# Patient Record
Sex: Female | Born: 1995 | Race: White | Hispanic: No | Marital: Single | Smoking: Never smoker
Health system: Southern US, Community
[De-identification: ages and names within clinical notes are randomized; demographics above are authoritative.]

---
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
## 2017-06-16 ENCOUNTER — Encounter: Payer: PRIVATE HEALTH INSURANCE | Admitting: Internal Medicine

## 2020-04-14 ENCOUNTER — Other Ambulatory Visit: Payer: Self-pay

## 2020-04-17 ENCOUNTER — Ambulatory Visit (INDEPENDENT_AMBULATORY_CARE_PROVIDER_SITE_OTHER): Payer: Self-pay | Admitting: Nurse Practitioner

## 2020-04-17 ENCOUNTER — Other Ambulatory Visit: Payer: Self-pay

## 2020-04-17 ENCOUNTER — Encounter: Payer: Self-pay | Admitting: Nurse Practitioner

## 2020-04-17 VITALS — BP 122/82 | HR 88 | Temp 97.7°F | Ht 61.75 in | Wt 197.0 lb

## 2020-04-17 DIAGNOSIS — Z789 Other specified health status: Secondary | ICD-10-CM | POA: Insufficient documentation

## 2020-04-17 DIAGNOSIS — Z298 Encounter for other specified prophylactic measures: Secondary | ICD-10-CM

## 2020-04-17 MED ORDER — FLUCONAZOLE 150 MG PO TABS: 150.0000 mg | ORAL_TABLET | Freq: Once | ORAL | 0 refills | Status: AC

## 2020-04-17 MED ORDER — DOXYCYCLINE HYCLATE 100 MG PO TABS: 100.0000 mg | ORAL_TABLET | Freq: Two times a day (BID) | ORAL | 0 refills | Status: AC

## 2020-04-17 NOTE — Progress Notes (Addendum)
Established Patient Office Visit  Subjective:  Patient ID: Veronica Webster, female    DOB: 1995-08-22  Age: 24 y.o. MRN: [id number]  CC:  Chief Complaint  Patient presents with  . Going to [place] on a mission trip and would like to get some Malaria medication in case she needs it.    HPI Veronica Webster presents for malaria prophylaxis while she travels abroad to [place] for a mission trip. She states she has previously traveled abroad for a mission trip and has received all required vaccinations. She denies any recent illnesses or sick contacts. She denies being currently sexually active or possible pregnancy.  History reviewed. No pertinent past medical history.  History reviewed. No pertinent surgical history.  Family History  Problem Relation Age of Onset  . Hypertension Other   . Cancer Other   . Diabetes Mellitus II Other     Social History   Socioeconomic History  . Marital status: Single    Spouse name: Not on file  . Number of children: Not on file  . Years of education: Not on file  . Highest education level: Not on file  Occupational History  . Not on file  Tobacco Use  . Smoking status: Never Smoker  . Smokeless tobacco: Never Used  Vaping Use  . Vaping Use: Never used  Substance and Sexual Activity  . Alcohol use: Never  . Drug use: Never  . Sexual activity: Not on file  Other Topics Concern  . Not on file  Social History Narrative  . Not on file   Social Determinants of Health   Financial Resource Strain:   . Difficulty of Paying Living Expenses: Not on file  Food Insecurity:   . Worried About Charity fundraiser in the Last Year: Not on file  . Ran Out of Food in the Last Year: Not on file  Transportation Needs:   . Lack of Transportation (Medical): Not on file  . Lack of Transportation (Non-Medical): Not on file  Physical Activity:   . Days of Exercise per Week: Not on file  . Minutes of Exercise per Session: Not on file    Stress:   . Feeling of Stress : Not on file  Social Connections:   . Frequency of Communication with Friends and Family: Not on file  . Frequency of Social Gatherings with Friends and Family: Not on file  . Attends Religious Services: Not on file  . Active Member of Clubs or Organizations: Not on file  . Attends Archivist Meetings: Not on file  . Marital Status: Not on file  Intimate Partner Violence:   . Fear of Current or Ex-Partner: Not on file  . Emotionally Abused: Not on file  . Physically Abused: Not on file  . Sexually Abused: Not on file    No outpatient medications prior to visit.   No facility-administered medications prior to visit.    Allergies  Allergen Reactions  . Sulfa Antibiotics Hives    ROS Review of Systems  Constitutional: Negative for activity change, appetite change, chills, fatigue, fever and unexpected weight change.  HENT: Negative for congestion.   Respiratory: Negative for cough.   Cardiovascular: Negative for chest pain and leg swelling.  Endocrine: Negative for cold intolerance and heat intolerance.  Genitourinary: Negative for difficulty urinating and dysuria.  Skin: Negative.   Allergic/Immunologic: Negative for environmental allergies and food allergies.  Neurological: Negative for weakness and headaches.  Hematological:  Negative for adenopathy.  Psychiatric/Behavioral: Negative.       Objective:    Physical Exam HENT:     Head: Normocephalic.     Right Ear: Tympanic membrane normal.     Mouth/Throat:     Mouth: Mucous membranes are moist.  Cardiovascular:     Rate and Rhythm: Normal rate and regular rhythm.     Pulses: Normal pulses.     Heart sounds: Normal heart sounds.  Pulmonary:     Effort: Pulmonary effort is normal.     Breath sounds: Normal breath sounds.  Abdominal:     Palpations: Abdomen is soft.  Skin:    General: Skin is warm and dry.  Neurological:     Mental Status: She is alert and oriented  to person, place, and time.  Psychiatric:        Mood and Affect: Mood normal.        Behavior: Behavior normal.     BP 122/82 (BP Location: Left Arm, Patient Position: Sitting)   Pulse 88   Temp 97.7 F (36.5 C) (Temporal)   Ht 5' 1.75" (1.568 m)   Wt 197 lb (89.4 kg)   SpO2 100%   BMI 36.32 kg/m  Wt Readings from Last 3 Encounters:  04/17/20 197 lb (89.4 kg)     Health Maintenance Due  Topic Date Due  . Hepatitis C Screening  Never done  . COVID-19 Vaccine (1) Never done  . HIV Screening  Never done  . PAP-Cervical Cytology Screening  Never done  . PAP SMEAR-Modifier  Never done  . INFLUENZA VACCINE  02/16/2020  . TETANUS/TDAP  03/29/2020    There are no preventive care reminders to display for this patient.  No results found for: TSH No results found for: WBC, HGB, HCT, MCV, PLT No results found for: NA, K, CHLORIDE, CO2, GLUCOSE, BUN, CREATININE, BILITOT, ALKPHOS, AST, ALT, PROT, ALBUMIN, CALCIUM, ANIONGAP, EGFR, GFR No results found for: CHOL No results found for: HDL No results found for: LDLCALC No results found for: TRIG No results found for: CHOLHDL No results found for: HGBA1C    Assessment & Plan:  1. History of international travel - doxycycline (VIBRA-TABS) 100 MG tablet; Take 1 tablet (100 mg total) by mouth 2 (two) times daily for 10 days.  Dispense: 20 tablet; Refill: 0 - fluconazole (DIFLUCAN) 150 MG tablet; Take 1 tablet (150 mg total) by mouth once for 1 dose.  Dispense: 1 tablet; Refill: 0  2. Need for malaria prophylaxis Doxycycline 185m daily during mission trip to [place] for 28 days daily upon return to the [place] Patient's family notified of medication dosage change, verbalized understanding.    Meds ordered this encounter  Medications  . doxycycline (VIBRA-TABS) 100 MG tablet    Sig: Take 1 tablet (100 mg total) by mouth 2 (two) times daily for 10 days.    Dispense:  20 tablet    Refill:  0    Order Specific Question:    Supervising Provider    Answer:Rochel Brome[[id number] . fluconazole (DIFLUCAN) 150 MG tablet    Sig: Take 1 tablet (150 mg total) by mouth once for 1 dose.    Dispense:  1 tablet    Refill:  0    Order Specific Question:   Supervising Provider    Answer:Shelton Silvas   Follow-up: Return for any symptoms or complications.   Upon review of standard of care following  pt departure from office, I changed her doxycycline to 100 mg once daily prior to and during trip and for 28 days after return. Notified patient's mother by phone, Veronica Webster, who will notify patient of change.  Rip Harbour, NP

## 2020-04-17 NOTE — Patient Instructions (Addendum)
Follow-up as needed  Malaria  Malaria is a disease that is caused by a type of germ that can live inside a person's body (parasite). The malaria parasite can get into a person's blood when he or she is bitten by a certain type of mosquito (Anopheles mosquito). These mosquitoes are most common in tropical areas of the world. Usually, they are not found in the [place]. If an infected mosquito bites you, the parasites can travel through your blood to your liver. The parasites mature in your liver, then they are released into your blood. They can then invade red blood cells. The parasites multiply inside red blood cells and cause the cells to break open (rupture). This infects more red blood cells. Losing red blood cells may cause you to have a low red blood cell count (anemia). What are the causes? This condition is caused by a Plasmodium parasite. Five different types of the parasite can cause disease in humans. Most cases of malaria come from a mosquito bite, but the disease can also be passed:  Through a blood transfusion.  Through an organ transplant.  By sharing used needles or syringes.  From an infected pregnant woman to her unborn baby before or during delivery. What increases the risk? This condition is more likely to develop in:  People who live or travel in an area of the world where malaria is common.  People who receive donated blood (transfusion) or an organ transplant that is contaminated with infected blood cells.  People who share needles with a person who is infected with malaria.  Children.  Pregnant women.  People who have never been exposed to malaria parasites before. These people have not built up protection (immunity) against the parasites. What are the signs or symptoms? Symptoms can vary depending on which parasite caused the infection. Symptoms usually come and go or occur in cycles. The first symptoms of this condition usually start 10 days to 4 weeks  after the mosquito bite. Symptoms for all types of malaria usually happen in this order:  Chills, along with headache, muscle aches, fatigue, nausea, and vomiting.  Fever, along with hot and dry skin.  Headache.  Low blood pressure (hypotension).  Drenching sweats, along with weakness and exhaustion. Other symptoms include:  Diarrhea or bloody stools (feces).  Blood in the urine.  Low blood sugar (hypoglycemia).  Yellowing of the skin and the whites of the eyes (jaundice).  Enlarged spleen or liver.  Kidney function problems. Severe symptoms include:  Trouble breathing.  Seizures.  Confusion or loss of consciousness (coma).  Problems with blood clotting. How is this diagnosed? This condition may be diagnosed based on:  Your symptoms and medical history. Your health care provider may suspect malaria if you have been living or traveling in an area where the disease is common.  A physical exam.  Blood tests to confirm the diagnosis. These may include: ? Examining a blood sample under a microscope. This is the most common way to diagnose malaria. Each type of parasite looks different under the microscope. Identifying which parasite is causing your infection will help your health care provider decide which medicines will work best for treatment. ? Complete blood count (CBC) to check for anemia. How is this treated? This condition may be treated with many different medicines or combinations of medicines, often requiring treatment in the hospital. The best treatment for you will depend on:  Which type of parasite is causing your infection.  Whether various medicines  are effective against it.  How you got the infection.  How severe your infection is.  Your age and your general health.  If you are pregnant. Follow these instructions at home:   Take over-the-counter and prescription medicines only as told by your health care provider.  Rest at home until your  symptoms improve.  Drink enough fluid to keep your urine pale yellow.  Keep all follow-up visits as told by your health care provider. This is important. How is this prevented?  If you will be traveling to an area where malaria is common: ? Visit the website of the Centers for Disease Control and Prevention (CDC) to check your risk: KeyPreview.sewww.cdc.gov/malaria/travelers/index.html ? Visit your health care provider at least 2 weeks before you leave. You may be given a medicine to help prevent malaria.  You can also prevent malaria by: ? Using insect repellent that has diethyltoluamide (DEET) in it. ? Staying indoors after daytime starts to turn to night or dusk. ? Wearing protective clothing that covers your arms and legs. ? Hanging a mosquito net over your bed. Contact a health care provider if:  You have any malaria symptoms.  You develop jaundice. Get help right away if:  You have a seizure.  You have bleeding.  You have trouble breathing. Summary  Malaria is a disease that is caused by a type of germ that can live inside a person's body (parasite). The malaria parasite can get into a person's blood when he or she is bitten by a certain type of mosquito.  Malaria can be prevented by taking measures to prevent mosquito bites.  Treatment for malaria depends on many factors, including how severe the infection is and which type of parasite is causing it.  Follow instructions from your health care provider about medicines, rest, getting enough fluids, and keeping all follow-up visits. This information is not intended to replace advice given to you by your health care provider. Make sure you discuss any questions you have with your health care provider. Document Revised: 04/20/2018 Document Reviewed: 04/20/2018 Elsevier Patient Education  2020 [employer] Place travel patient instructions here.  Doxycycline delayed-release capsules What is this medicine? DOXYCYCLINE (dox i SYE  kleen) is a tetracycline antibiotic. It is used to treat certain kinds of bacterial infections, Lyme disease, and malaria. It will not work for colds, flu, or other viral infections. This medicine may be used for other purposes; ask your health care provider or pharmacist if you have questions. COMMON BRAND NAME(S): Oracea What should I tell my health care provider before I take this medicine? They need to know if you have any of these conditions:  bowel disease like colitis  liver disease  long exposure to sunlight like working outdoors  an unusual or allergic reaction to doxycycline, tetracycline antibiotics, other medicines, foods, dyes, or preservatives  pregnant or trying to get pregnant  breast-feeding How should I use this medicine? Take this medicine by mouth with a full glass of water. Follow the directions on the prescription label. Do not crush or chew. The capsules may be opened and the pellets sprinkled on applesauce. Swallow the pellets whole without chewing. Follow with an 8 ounce glass of water to help you swallow all the pellets. Do not prepare a dose and store for later use. The applesauce mixture should be taken immediately after you prepare it. It is best to take this medicine without other food, but if it upsets your stomach take it with food. Take your  medicine at regular intervals. Do not take your medicine more often than directed. Take all of your medicine as directed even if you think your are better. Do not skip doses or stop your medicine early. Talk to your pediatrician regarding the use of this medicine in children. While this drug may be prescribed for selected conditions, precautions do apply. Overdosage: If you think you have taken too much of this medicine contact a poison control center or emergency room at once. NOTE: This medicine is only for you. Do not share this medicine with others. What if I miss a dose? If you miss a dose, take it as soon as you can.  If it is almost time for your next dose, take only that dose. Do not take double or extra doses. What may interact with this medicine?  antacids  barbiturates  birth control pills  bismuth subsalicylate  carbamazepine  methoxyflurane  other antibiotics  phenytoin  vitamins that contain iron  warfarin This list may not describe all possible interactions. Give your health care provider a list of all the medicines, herbs, non-prescription drugs, or dietary supplements you use. Also tell them if you smoke, drink alcohol, or use illegal drugs. Some items may interact with your medicine. What should I watch for while using this medicine? Tell your doctor or health care professional if your symptoms do not improve. Do not treat diarrhea with over the counter products. Contact your doctor if you have diarrhea that lasts more than 2 days or if it is severe and watery. Do not take this medicine just before going to bed. It may not dissolve properly when you lay down and can cause pain in your throat. Drink plenty of fluids while taking this medicine to also help reduce irritation in your throat. This medicine can make you more sensitive to the sun. Keep out of the sun. If you cannot avoid being in the sun, wear protective clothing and use sunscreen. Do not use sun lamps or tanning beds/booths. If you are being treated for a sexually transmitted infection, avoid sexual contact until you have finished your treatment. Your sexual partner may also need treatment. Avoid antacids, aluminum, calcium, magnesium, and iron products for 4 hours before and 2 hours after taking a dose of this medicine. Birth control pills may not work properly while you are taking this medicine. Talk to your doctor about using an extra method of birth control. If you are using this medicine to prevent malaria, you should still protect yourself from contact with mosquitos. Stay in screened-in areas, use mosquito nets, keep  your body covered, and use an insect repellent. What side effects may I notice from receiving this medicine? Side effects that you should report to your doctor or health care professional as soon as possible:  allergic reactions like skin rash, itching or hives, swelling of the face, lips, or tongue  difficulty breathing  fever  itching in the rectal or genital area  pain on swallowing  rash, fever, and swollen lymph nodes  redness, blistering, peeling or loosening of the skin, including inside the mouth  severe stomach pain or cramps  unusual bleeding or bruising  unusually weak or tired  yellowing of the eyes or skin Side effects that usually do not require medical attention (report to your doctor or health care professional if they continue or are bothersome):  diarrhea  loss of appetite  nausea, vomiting This list may not describe all possible side effects. Call your doctor for  medical advice about side effects. You may report side effects to FDA at 1-800-FDA-1088. Where should I keep my medicine? Keep out of the reach of children. Store at room temperature, below 25 degrees C (77 degrees F). Protect from light. Keep container tightly closed. Throw away any unused medicine after the expiration date. Taking this medicine after the expiration date can make you seriously ill. NOTE: This sheet is a summary. It may not cover all possible information. If you have questions about this medicine, talk to your doctor, pharmacist, or health care provider.  2020 Elsevier/Gold Standard (2018-10-04 13:23:57)

## 2020-04-20 DIAGNOSIS — Z298 Encounter for other specified prophylactic measures: Secondary | ICD-10-CM | POA: Insufficient documentation

## 2020-04-20 MED ORDER — DOXYCYCLINE HYCLATE 100 MG PO TABS: ORAL_TABLET | ORAL | 0 refills | Status: DC

## 2020-04-27 ENCOUNTER — Ambulatory Visit: Payer: PRIVATE HEALTH INSURANCE | Admitting: Nurse Practitioner

## 2020-07-21 ENCOUNTER — Telehealth (INDEPENDENT_AMBULATORY_CARE_PROVIDER_SITE_OTHER): Payer: Self-pay | Admitting: Nurse Practitioner

## 2020-07-21 ENCOUNTER — Encounter: Payer: Self-pay | Admitting: Nurse Practitioner

## 2020-07-21 VITALS — Ht 62.0 in | Wt 197.0 lb

## 2020-07-21 DIAGNOSIS — U071 COVID-19: Secondary | ICD-10-CM

## 2020-07-21 MED ORDER — LORATADINE 10 MG PO TABS: 10.0000 mg | ORAL_TABLET | Freq: Every day | ORAL | 0 refills | Status: DC

## 2020-07-21 MED ORDER — FLUTICASONE PROPIONATE 50 MCG/ACT NA SUSP: 2.0000 | Freq: Every day | NASAL | 6 refills | Status: DC

## 2020-07-21 NOTE — Progress Notes (Signed)
Virtual Visit via Telephone Note   This visit type was conducted due to national recommendations for restrictions regarding the COVID-19 Pandemic (e.g. social distancing) in an effort to limit this patient's exposure and mitigate transmission in our community.  Due to her co-morbid illnesses, this patient is at least at moderate risk for complications without adequate follow up.  This format is felt to be most appropriate for this patient at this time.  The patient did not have access to video technology/had technical difficulties with video requiring transitioning to audio format only (telephone).  All issues noted in this document were discussed and addressed.  No physical exam could be performed with this format.  Patient verbally consented to a telehealth visit.   Date:  07/21/2020   ID:  Veronica Webster, DOB 1995-10-31, MRN [id number]  Patient Location: Home Provider Location: Office/Clinic  PCP:  Janie Morning, NP   Evaluation Performed:  Follow-Up Visit  Chief Complaint:  COVID-19 positive  History of Present Illness:    Veronica Webster is a 25 y.o. female with sinus congestion, sore throat, and bilateral ear pain. Onset of symptoms was 3-days ago. Treatment has included Ibuprofen, Mucinex, and Vitamins. She performed a home COVID-19 test 2-days ago that was positive.   The patient does have symptoms concerning for COVID-19 infection (fever, chills, cough, or new shortness of breath).    History reviewed. No pertinent past medical history.  History reviewed. No pertinent surgical history.  Family History  Problem Relation Age of Onset  . Hypertension Other   . Cancer Other   . Diabetes Mellitus II Other     Social History   Socioeconomic History  . Marital status: Single    Spouse name: Not on file  . Number of children: Not on file  . Years of education: Not on file  . Highest education level: Not on file  Occupational History  . Not on file  Tobacco Use  . Smoking  status: Never Smoker  . Smokeless tobacco: Never Used  Vaping Use  . Vaping Use: Never used  Substance and Sexual Activity  . Alcohol use: Never  . Drug use: Never  . Sexual activity: Not on file  Other Topics Concern  . Not on file  Social History Narrative  . Not on file   Social Determinants of Health   Financial Resource Strain: Not on file  Food Insecurity: Not on file  Transportation Needs: Not on file  Physical Activity: Not on file  Stress: Not on file  Social Connections: Not on file  Intimate Partner Violence: Not on file    Outpatient Medications Prior to Visit  Medication Sig Dispense Refill  . doxycycline (VIBRA-TABS) 100 MG tablet Take one tablet daily by mouth for 28 days after returning to [place] for malaria prophylaxis 24 tablet 0   No facility-administered medications prior to visit.    Allergies:   Sulfa antibiotics   Social History   Tobacco Use  . Smoking status: Never Smoker  . Smokeless tobacco: Never Used  Vaping Use  . Vaping Use: Never used  Substance Use Topics  . Alcohol use: Never  . Drug use: Never     Review of Systems  Constitutional: Positive for malaise/fatigue. Negative for fever.  HENT: Positive for congestion, ear pain (Bilateral), sinus pain and sore throat.   Respiratory: Positive for cough and sputum production. Negative for shortness of breath and wheezing.   Cardiovascular: Negative for chest pain.  Gastrointestinal: Negative for  abdominal pain, constipation, diarrhea, heartburn, nausea and vomiting.  Genitourinary: Negative for frequency and urgency.  Musculoskeletal: Negative for back pain, joint pain and myalgias.  Neurological: Negative for dizziness and headaches.     Labs/Other Tests and Data Reviewed:    Recent Labs: No results found for requested labs within last 8760 hours.   Recent Lipid Panel No results found for: CHOL, TRIG, HDL, CHOLHDL, LDLCALC, LDLDIRECT  Wt Readings from Last 3 Encounters:   07/21/20 197 lb (89.4 kg)  04/17/20 197 lb (89.4 kg)     Objective:    Vital Signs:  Ht 5\' 2"  (1.575 m)   Wt 197 lb (89.4 kg)   BMI 36.03 kg/m    Physical Exam Vitals reviewed.    No physical exam due to telemedicine visit  ASSESSMENT & PLAN:     1. COVID-19 - fluticasone (FLONASE) 50 MCG/ACT nasal spray; Place 2 sprays into both nostrils daily.  Dispense: 16 g; Refill: 6 - loratadine (CLARITIN) 10 MG tablet; Take 1 tablet (10 mg total) by mouth daily.  Dispense: 90 tablet; Refill: 0    COVID-19 Education: The signs and symptoms of COVID-19 were discussed with the patient and how to seek care for testing (follow up with PCP or arrange E-visit). The importance of social distancing was discussed today.   I spent 10 minutes dedicated to the care of this patient on the date of this encounter to include telephone time with the patient, as well as:EMR and prescription managemnt  Follow Up:  Virtual Visit  prn  Signed,  , DNP 07/21/2020 21:52 [facility] [place]

## 2021-08-16 ENCOUNTER — Other Ambulatory Visit: Payer: Self-pay | Admitting: Family Medicine

## 2021-09-23 NOTE — Progress Notes (Signed)
? ?Acute Office Visit ? ?Subjective:  ? ? Patient ID: Veronica Webster, female    DOB: 08/18/1995, 26 y.o.   MRN: [id number] ? ?Chief Complaint  ?Patient presents with  ? Diarrhea  ? ? ?HPI: ?Veronica Webster is a 26 year old Caucasian female that presents for evaluation of diarrhea and vomiting. Onset of symptoms was 6-days ago. Treatment has included pushing fluid, Pepto Bismol and Imodium. 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 Denies fever, chills, rectal bleeding, or intractable vomiting.She has lost 9-pounds since last in-office visit on 04/17/20, pt states she weight loss was intentional and she is pleased.  ? ?History reviewed. No pertinent past medical history. ? ?History reviewed. No pertinent surgical history. ? ?Family History  ?Problem Relation Age of Onset  ? Hypertension Other   ? Cancer Other   ? Diabetes Mellitus II Other   ? ? ?Social History  ? ?Socioeconomic History  ? Marital status: Single  ?  Spouse name: Not on file  ? Number of children: Not on file  ? Years of education: Not on file  ? Highest education level: Not on file  ?Occupational History  ? Not on file  ?Tobacco Use  ? Smoking status: Never  ? Smokeless tobacco: Never  ?Vaping Use  ? Vaping Use: Never used  ?Substance and Sexual Activity  ? Alcohol use: Never  ? Drug use: Never  ? Sexual activity: Never  ?Other Topics Concern  ? Not on file  ?Social History Narrative  ? Not on file  ? ?Social Determinants of Health  ? ?Financial Resource Strain: Low Risk   ? Difficulty of Paying Living Expenses: Not hard at all  ?Food Insecurity: No Food Insecurity  ? Worried About Programme researcher, broadcasting/film/video in the Last Year: Never true  ? Ran Out of Food in the Last Year: Never true  ?Transportation Needs: No Transportation Needs  ? Lack of Transportation (Medical): No  ? Lack of Transportation (Non-Medical): No  ?Physical Activity: Inactive  ? Days of Exercise per Week: 0 days  ? Minutes of  Exercise per Session: 0 min  ?Stress: No Stress Concern Present  ? Feeling of Stress : Not at all  ?Social Connections: Moderately Integrated  ? Frequency of Communication with Friends and Family: More than three times a week  ? Frequency of Social Gatherings with Friends and Family: More than three times a week  ? Attends Religious Services: More than 4 times per year  ? Active Member of Clubs or Organizations: Yes  ? Attends Banker Meetings: More than 4 times per year  ? Marital Status: Never married  ?Intimate Partner Violence: Not At Risk  ? Fear of Current or Ex-Partner: No  ? Emotionally Abused: No  ? Physically Abused: No  ? Sexually Abused: No  ? ? ?Outpatient Medications Prior to Visit  ?Medication Sig Dispense Refill  ? fluticasone (FLONASE) 50 MCG/ACT nasal spray Place 2 sprays into both nostrils daily. (Patient not taking: Reported on 09/24/2021) 16 g 6  ? loratadine (CLARITIN) 10 MG tablet Take 1 tablet (10 mg total) by mouth daily. 90 tablet 0  ? ?No facility-administered medications prior to visit.  ? ? ?Allergies  ?Allergen Reactions  ? Sulfa Antibiotics Hives  ? ? ?Review of Systems  ?Constitutional:  Positive for fatigue. Negative for chills and fever.  ?HENT:  Negative for congestion, ear pain, rhinorrhea and sore throat.   ?Respiratory:  Negative for cough and shortness of breath.   ?Cardiovascular:  Negative for chest pain.  ?Gastrointestinal:  Positive for diarrhea, nausea and vomiting. Negative for abdominal pain and constipation.  ?Neurological:  Negative for dizziness, weakness and headaches.  ?All other systems reviewed and are negative. ? ?   ?Objective:  ?  ?Physical Exam ?Vitals reviewed.  ?Constitutional:   ?   Appearance: Normal appearance.  ?HENT:  ?   Head: Normocephalic.  ?   Right Ear: Tympanic membrane normal.  ?   Left Ear: Tympanic membrane normal.  ?   Nose: Nose normal.  ?   Mouth/Throat:  ?   Mouth: Mucous membranes are moist.  ?Eyes:  ?   Pupils: Pupils are  equal, round, and reactive to light.  ?Cardiovascular:  ?   Rate and Rhythm: Normal rate and regular rhythm.  ?   Pulses: Normal pulses.  ?   Heart sounds: Normal heart sounds.  ?Pulmonary:  ?   Effort: Pulmonary effort is normal.  ?   Breath sounds: Normal breath sounds.  ?Abdominal:  ?   General: Bowel sounds are normal.  ?   Palpations: Abdomen is soft.  ?Musculoskeletal:     ?   General: Normal range of motion.  ?Skin: ?   General: Skin is warm and dry.  ?   Capillary Refill: Capillary refill takes less than 2 seconds.  ?Neurological:  ?   General: No focal deficit present.  ?   Mental Status: She is alert and oriented to person, place, and time.  ?Psychiatric:     ?   Mood and Affect: Mood normal.     ?   Behavior: Behavior normal.  ? ? ?Pulse 91   Temp (!) 97.2 ?F (36.2 ?C)   Ht 5\' 2"  (1.575 m)   Wt 188 lb (85.3 kg)   LMP 09/03/2021   SpO2 99%   BMI 34.39 kg/m?  ?Wt Readings from Last 3 Encounters:  ?09/24/21 188 lb (85.3 kg)  ?07/21/20 197 lb (89.4 kg)  ?04/17/20 197 lb (89.4 kg)  ? ? ?   ?Assessment & Plan:  ? ?1. Traveler's diarrhea ?- azithromycin (ZITHROMAX) 250 MG tablet; Take 2 tablets on day 1, then 1 tablet daily on days 2 through 5  Dispense: 6 tablet; Refill: 0 ?- CBC with Differential/Platelet ?- Comprehensive metabolic panel ?- Cdiff NAA+O+P+Stool Culture ? ?2. History of international travel ?- azithromycin (ZITHROMAX) 250 MG tablet; Take 2 tablets on day 1, then 1 tablet daily on days 2 through 5  Dispense: 6 tablet; Refill: 0 ?  ? ? ?Take Z-pack as prescribed ?Push fluids ?We will call you with lab results ?Return stool studies to office within 12 hours ?Follow-up as needed ? ?Follow-up: PRN ? ?An After Visit Summary was printed and given to the patient. ? ?I, 06/17/20, NP, have reviewed all documentation for this visit. The documentation on 09/24/21 for the exam, diagnosis, procedures, and orders are all accurate and complete.  ? ? ?Signed, ?11/24/21, NP ?[facility] ?([phone] ?

## 2021-09-24 ENCOUNTER — Other Ambulatory Visit: Payer: Self-pay

## 2021-09-24 ENCOUNTER — Encounter: Payer: Self-pay | Admitting: Nurse Practitioner

## 2021-09-24 ENCOUNTER — Ambulatory Visit (INDEPENDENT_AMBULATORY_CARE_PROVIDER_SITE_OTHER): Payer: Self-pay | Admitting: Nurse Practitioner

## 2021-09-24 VITALS — BP 132/78 | HR 91 | Temp 97.2°F | Ht 62.0 in | Wt 188.0 lb

## 2021-09-24 DIAGNOSIS — Z789 Other specified health status: Secondary | ICD-10-CM

## 2021-09-24 DIAGNOSIS — A09 Infectious gastroenteritis and colitis, unspecified: Secondary | ICD-10-CM

## 2021-09-24 LAB — COMPREHENSIVE METABOLIC PANEL
ALT: 35 IU/L — ABNORMAL HIGH (ref 0–32)
AST: 27 IU/L (ref 0–40)
Albumin/Globulin Ratio: 1.7 (ref 1.2–2.2)
Albumin: 4.3 g/dL (ref 3.9–5.0)
Alkaline Phosphatase: 85 IU/L (ref 44–121)
BUN/Creatinine Ratio: 10 (ref 9–23)
BUN: 7 mg/dL (ref 6–20)
Bilirubin Total: 0.4 mg/dL (ref 0.0–1.2)
CO2: 23 mmol/L (ref 20–29)
Calcium: 9.2 mg/dL (ref 8.7–10.2)
Chloride: 106 mmol/L (ref 96–106)
Creatinine, Ser: 0.68 mg/dL (ref 0.57–1.00)
Globulin, Total: 2.6 g/dL (ref 1.5–4.5)
Glucose: 85 mg/dL (ref 70–99)
Potassium: 4 mmol/L (ref 3.5–5.2)
Sodium: 140 mmol/L (ref 134–144)
Total Protein: 6.9 g/dL (ref 6.0–8.5)
eGFR: 125 mL/min/{1.73_m2} (ref >59)

## 2021-09-24 LAB — CBC WITH DIFFERENTIAL/PLATELET
Basophils Absolute: 0.1 10*3/uL (ref 0.0–0.2)
Basos: 1 %
EOS (ABSOLUTE): 0.2 10*3/uL (ref 0.0–0.4)
Eos: 3 %
Hematocrit: 39.2 % (ref 34.0–46.6)
Hemoglobin: 13.5 g/dL (ref 11.1–15.9)
Immature Grans (Abs): 0 10*3/uL (ref 0.0–0.1)
Immature Granulocytes: 0 %
Lymphocytes Absolute: 2.2 10*3/uL (ref 0.7–3.1)
Lymphs: 24 %
MCH: 32.1 pg (ref 26.6–33.0)
MCHC: 34.4 g/dL (ref 31.5–35.7)
MCV: 93 fL (ref 79–97)
Monocytes Absolute: 0.6 10*3/uL (ref 0.1–0.9)
Monocytes: 6 %
Neutrophils Absolute: 5.9 10*3/uL (ref 1.4–7.0)
Neutrophils: 66 %
Platelets: 324 10*3/uL (ref 150–450)
RBC: 4.2 x10E6/uL (ref 3.77–5.28)
RDW: 12.8 % (ref 11.7–15.4)
WBC: 9 10*3/uL (ref 3.4–10.8)

## 2021-09-24 MED ORDER — AZITHROMYCIN 250 MG PO TABS: ORAL_TABLET | ORAL | 0 refills | Status: AC

## 2021-09-24 NOTE — Patient Instructions (Addendum)
Take Z-pack as prescribed Push fluids We will call you with lab results Return stool studies to office within 12 hours Follow-up as needed   Food Poisoning and Traveling Food poisoning is an illness that is caused by eating or drinking something that has been contaminated with toxins. Some types of food poisoning trigger symptoms within a few hours. Others may take 1-2 weeks for symptoms to appear. Symptoms of food poisoning may include: Diarrhea. Cramping or abdominal pain. Nausea and vomiting. Fever. Dizziness. Aches and pains. Before you travel, learn about the foodborne illnesses that are common in the areas you will be visiting. The risk for food poisoning varies from country to country and from one region of the world to another. What types of illness can be passed through food and drinks? Most cases of food poisoning are caused by bacteria or viruses. Untreated, these cases usually last 2-7 days. Food poisoning can also be caused by some microscopic parasites. Parasites are organisms that live off another larger organism. Illness caused by parasites can take 1-2 weeks to appear and may last several months. What actions can I take to lower my risk of food poisoning while traveling?  Wash your hands with soap and water often, especially before eating food. Carry travel-size bottles of alcohol-based hand sanitizer. Use it when you do not have access to soap and water. Understand what foods and drinks are generally safe, and what foods and drinks to avoid. Foods that are generally safe Food that is thoroughly cooked. Food that is served hot. Hard-boiled eggs. Fruits and vegetables that you wash and peel yourself. Cheese that has been treated with high heat (pasteurized). Drinks that are generally safe Pasteurized milk. Bottled waters, soda, or sports drinks. Drinks that you know were sealed until you opened them. Water that you know has been treated, boiled, or filtered to remove  microorganisms. Ice from treated or bottled water. Drinks made with boiling water, such as tea or coffee. Foods to avoid Raw or undercooked foods. Raw or runny eggs. Food that is not hot, such as food that has been on a buffet or picnic table for a while. Raw fruits or vegetables that have not been washed and peeled. Other items made with fresh vegetables or fruits, such as salad and salsa. Cheese that has not been pasteurized. Meat from local animals, such as monkeys and bats. Drinks to avoid Water from the tap or a well. Water from a fresh-water source, such as a stream. Ice from a tap, well, or fresh-water source. Beverages that include water from a well, tap, or fresh-water source. Milk that is not pasteurized. Beverages from soda fountains. What countries have a higher risk for food poisoning? Countries with a high risk Countries in [place]. Countries in the [place]. Countries in [place]. [place]. Countries in [place] and [place]. Countries with a mid-range risk Countries in [place]. [place]. Some [place]. Countries with a low risk [place]. [place]. [place]. [place]. [place]. Some countries in [place] or [place]. Questions to ask your health care provider What is my personal risk for getting food poisoning while traveling? Can you prescribe medicines to prevent food poisoning or to treat symptoms like diarrhea? How do I take the medicines you prescribed? What over-the-counter medicines can I buy to help with food poisoning? How do I take care of myself if I think I have food poisoning while traveling? Does the area I am traveling to have a low, medium, or high  risk for foodborne illness? Where to find more information Visit a travel medicine clinic or speak with a health care provider who specializes in travel medicine as soon as you know your travel plans. Check the Travelers' Health section on the website of the  Centers for Disease Control and Prevention (CDC): https://barron.com/ Contact a health care provider if: You develop symptoms of food poisoning during or after travel. Summary Food poisoning is an illness that is caused by eating or drinking something contaminated with toxins. Symptoms can occur within hours or may take 1-2 weeks to appear. Symptoms include diarrhea, cramping, fever, nausea and vomiting, dizziness, aches, and pains. Before you travel, learn about the foodborne illnesses that are common in the areas you intend to visit. Consider visiting a travel medicine clinic before your trip. While traveling, be aware of which foods and drinks are generally safe or should be avoided. Wash your hands with soap and water often. Use alcohol-based hand sanitizer if you do not have access to soap and water. This information is not intended to replace advice given to you by your health care provider. Make sure you discuss any questions you have with your health care provider. Document Revised: 04/15/2020 Document Reviewed: 04/16/2020 Elsevier Patient Education  2022 Elsevier Inc.   Food Choices to Help Relieve Diarrhea, Adult Diarrhea can make you feel weak and cause you to become dehydrated. It is important to choose the right foods and drinks to: Relieve diarrhea. Replace lost fluids and nutrients. Prevent dehydration. What are tips for following this plan? Relieving diarrhea Avoid foods that make your diarrhea worse. These may include: Foods and beverages sweetened with high-fructose corn syrup, honey, or sweeteners such as xylitol, sorbitol, and mannitol. Fried, greasy, or spicy foods. Raw fruits and vegetables. Eat foods that are rich in probiotics. These include foods such as yogurt and fermented milk products. Probiotics can help increase healthy bacteria in your stomach and intestines (gastrointestinal tract or GI tract). This may help digestion and stop diarrhea. If you have  lactose intolerance, avoid dairy products. These may make your diarrhea worse. Take medicine to help stop diarrhea only as told by your health care provider. Replacing nutrients  Eat bland, easy-to-digest foods in small amounts as you are able, until your diarrhea starts to get better. These foods include bananas, applesauce, rice, toast, and crackers. Gradually reintroduce nutrient-rich foods as tolerated or as told by your health care provider. This includes: Well-cooked protein foods, such as eggs, lean meats like fish or chicken without skin, and tofu. Peeled, seeded, and soft-cooked fruits and vegetables. Low-fat dairy products. Whole grains. Take vitamin and mineral supplements as told by your health care provider. Preventing dehydration  Start by sipping water or a solution to prevent dehydration (oral rehydration solution, ORS). This is a drink that helps replace fluids and minerals your body has lost. You can buy an ORS at pharmacies and retail stores. Try to drink at least 8-10 cups (2,000-2,500 mL) of fluid each day to help replace lost fluids. If you have urine that is pale yellow, you are getting enough fluids. You may drink other liquids in addition to water, such as fruit juice that you have added water to (diluted fruit juice) or low-calorie sports drinks, as tolerated or as told by your health care provider. Avoid drinks with caffeine, such as coffee, tea, or soft drinks. Avoid alcohol. Summary When you have diarrhea, it is important to choose the right foods and drinks to relieve diarrhea, to replace lost  fluids and nutrients, and to prevent dehydration. Make sure you drink enough fluid to keep your urine pale yellow. You may benefit from eating bland foods at first. Gradually reintroduce healthy, nutrient-rich foods as tolerated or as told by your health care provider. Avoid foods that make your diarrhea worse, such as fried, greasy, or spicy foods. This information is not  intended to replace advice given to you by your health care provider. Make sure you discuss any questions you have with your health care provider. Document Revised: 08/20/2019 Document Reviewed: 08/20/2019 Elsevier Patient Education  2022 [employer].

## 2023-04-14 ENCOUNTER — Ambulatory Visit: Payer: Self-pay

## 2023-04-14 VITALS — BP 110/70 | HR 93 | Temp 97.6°F | Resp 14 | Ht 62.0 in | Wt 206.0 lb

## 2023-04-14 DIAGNOSIS — G8929 Other chronic pain: Secondary | ICD-10-CM | POA: Insufficient documentation

## 2023-04-14 DIAGNOSIS — Z6837 Body mass index (BMI) 37.0-37.9, adult: Secondary | ICD-10-CM

## 2023-04-14 DIAGNOSIS — R5382 Chronic fatigue, unspecified: Secondary | ICD-10-CM | POA: Insufficient documentation

## 2023-04-14 DIAGNOSIS — E6609 Other obesity due to excess calories: Secondary | ICD-10-CM | POA: Insufficient documentation

## 2023-04-14 DIAGNOSIS — M545 Low back pain, unspecified: Secondary | ICD-10-CM

## 2023-04-14 NOTE — Assessment & Plan Note (Signed)
Reports fatigue approximately since January 2024. Looks like she was treated for H. pylori gastritis in [place] earlier this year.  Plan: Will order CBC, CMP and TSH to rule out anemia, thyroid abnormalities etc. -Advised to add structured exercise program and work on weight management to improve her energy levels. -Will review results and make recommendations.  Follow-up as needed

## 2023-04-14 NOTE — Assessment & Plan Note (Signed)
Veronica Webster is a 27 year old female, an established patient of this practice, here for a problem visit. Complains of chronic left-sided low back pain for almost 10 years. Appears to be very localized, musculoskeletal on exam and history and prior imaging.  Plan: Unfortunately she does not have medical insurance. -Reassured that this seems to be purely musculoskeletal. -Advised to try core strengthening exercises by looking up YouTube videos. -Stressed the importance of weight management. -Offered muscle relaxants but she declined. -To report back if any worse

## 2023-04-14 NOTE — Patient Instructions (Signed)
The left low back pain appears to be musculoskeletal. Please try to find core strengthening exercises online and do on a consistent basis Work on eating healthier and exercising regularly Blood work today Call back if you feel the need for a muscle relaxant Return as needed

## 2023-04-14 NOTE — Assessment & Plan Note (Signed)
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 Weight management recommended. Advised to add strength training and add aerobic exercises if possible.  She states she is pretty physically active and does hiking etc. on a regular basis.

## 2023-04-14 NOTE — Progress Notes (Signed)
Acute Office Visit  Subjective:    Patient ID: Veronica Webster, female    DOB: 1995-11-16, 27 y.o.   MRN: [id number]  Chief Complaint  Patient presents with   Back Pain    Lower back pain on left side    HPI: Patient is in today for chronic lower back pain on the left sided which probably started 10 years ago.  It went away after she went [place] 2019.  Last week, she noticed the pain again. She mentioned nauseas, vomiting x2 for the pain. Pain is 7/10 scale. It is dull pain and constant with radiation to the abdomen. She has been taking ibuprofen and tylenol, but it did not help with the pain.  States she believes in the power of prayer, people prayed for her and the pain disappeared in 2019.  If she exerts herself, the pain comes back. NO RADIATION INTO THE LEFT LEG  Had imaging done in the past. Has never tried physical therapy Does not exercise. 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 Wears a fitbit and gets about 6000-8000 or more steps.  Denies any burning urination. Pain not related to food intake. Not related to bowel movement. Worse with activity Sometimes bothers her in sleep.  FATIGUE- related to her pain. States she does not want to do anything but sleep.  Was in [place], in January 2024, was hospitalized with a bacterial infection, diarrhea, did not eat for a month, was very sick. Was admitted for 3 days back then.  States she has been fatigued since then.       History reviewed. No pertinent past medical history.  History reviewed. No pertinent surgical history.  Family History  Problem Relation Age of Onset   Hypertension Other    Cancer Other    Diabetes Mellitus II Other     Social History   Socioeconomic History   Marital status: Single    Spouse name: Not on file   Number of children: Not on file   Years of education: Not on file   Highest education level: Not on file  Occupational History   Not on file  Tobacco Use   Smoking status:  Never   Smokeless tobacco: Never  Vaping Use   Vaping status: Never Used  Substance and Sexual Activity   Alcohol use: Never   Drug use: Never   Sexual activity: Never  Other Topics Concern   Not on file  Social History Narrative   Not on file   Social Determinants of Health   Financial Resource Strain: Low Risk  (09/24/2021)   Overall Financial Resource Strain (CARDIA)    Difficulty of Paying Living Expenses: Not hard at all  Food Insecurity: No Food Insecurity (09/24/2021)   Hunger Vital Sign    Worried About Running Out of Food in the Last Year: Never true    Ran Out of Food in the Last Year: Never true  Transportation Needs: No Transportation Needs (09/24/2021)   PRAPARE - Administrator, Civil Service (Medical): No    Lack of Transportation (Non-Medical): No  Physical Activity: Inactive (09/24/2021)   Exercise Vital Sign    Days of Exercise per Week: 0 days    Minutes of Exercise per Session: 0 min  Stress: No Stress Concern Present (09/24/2021)   [employer] of Occupational Health - Occupational Stress Questionnaire    Feeling of Stress : Not at all  Social Connections: Moderately Integrated (09/24/2021)   Social  Connection and Isolation Panel [NHANES]    Frequency of Communication with Friends and Family: More than three times a week    Frequency of Social Gatherings with Friends and Family: More than three times a week    Attends Religious Services: More than 4 times per year    Active Member of [employer] or Organizations: Yes    Attends Banker Meetings: More than 4 times per year    Marital Status: Never married  Intimate Partner Violence: Not At Risk (09/24/2021)   Humiliation, Afraid, Rape, and Kick questionnaire    Fear of Current or Ex-Partner: No    Emotionally Abused: No    Physically Abused: No    Sexually Abused: No    No outpatient medications prior to visit.   No facility-administered medications prior to visit.    Allergies   Allergen Reactions   Misc. Sulfonamide Containing Compounds Hives   Sulfa Antibiotics Hives    Review of Systems  Constitutional:  Positive for fatigue. Negative for chills and fever.  HENT:  Negative for congestion, ear pain and sore throat.   Respiratory:  Negative for cough and shortness of breath.   Cardiovascular:  Negative for chest pain and palpitations.  Gastrointestinal:  Positive for nausea and vomiting. Negative for abdominal pain, constipation and diarrhea.  Endocrine: Negative for polydipsia, polyphagia and polyuria.  Genitourinary:  Negative for difficulty urinating and dysuria.  Musculoskeletal:  Positive for back pain. Negative for arthralgias and myalgias.  Skin:  Negative for rash.  Neurological:  Negative for headaches.  Psychiatric/Behavioral:  Negative for dysphoric mood. The patient is not nervous/anxious.        Objective:        04/14/2023    9:32 AM 09/24/2021    8:46 AM 07/21/2020   10:54 AM  Vitals with BMI  Height 5\' 2"  5\' 2"  5\' 2"   Weight 206 lbs 188 lbs 197 lbs  BMI 37.67 34.38 36.02  Systolic 110 132   Diastolic 70 78   Pulse 93 91     No data found.   Physical Exam Constitutional:      Appearance: She is obese.  HENT:     Head: Normocephalic and atraumatic.  Cardiovascular:     Rate and Rhythm: Normal rate and regular rhythm.  Pulmonary:     Effort: Pulmonary effort is normal.     Breath sounds: Normal breath sounds.  Musculoskeletal:        General: Tenderness (localized tenderness to palpation of left lower paralumbar muscles) present.  Neurological:     Mental Status: She is alert.     Health Maintenance Due  Topic Date Due   Cervical Cancer Screening (Pap smear)  Never done   INFLUENZA VACCINE  02/16/2023    There are no preventive care reminders to display for this patient.   No results found for: "TSH" Lab Results  Component Value Date   WBC 9.0 09/24/2021   HGB 13.5 09/24/2021   HCT 39.2 09/24/2021   MCV 93  09/24/2021   PLT 324 09/24/2021   Lab Results  Component Value Date   NA 140 09/24/2021   K 4.0 09/24/2021   CO2 23 09/24/2021   GLUCOSE 85 09/24/2021   BUN 7 09/24/2021   CREATININE 0.68 09/24/2021   BILITOT 0.4 09/24/2021   ALKPHOS 85 09/24/2021   AST 27 09/24/2021   ALT 35 (H) 09/24/2021   PROT 6.9 09/24/2021   ALBUMIN 4.3 09/24/2021   CALCIUM 9.2 09/24/2021  EGFR 125 09/24/2021   No results found for: "CHOL" No results found for: "HDL" No results found for: "LDLCALC" No results found for: "TRIG" No results found for: "CHOLHDL" No results found for: "HGBA1C"     Assessment & Plan:  Chronic fatigue Assessment & Plan: Reports fatigue approximately since January 2024. Looks like she was treated for H. pylori gastritis in [place] earlier this year.  Plan: Will order CBC, CMP and TSH to rule out anemia, thyroid abnormalities etc. -Advised to add structured exercise program and work on weight management to improve her energy levels. -Will review results and make recommendations.  Follow-up as needed  Orders: -     CBC with Differential/Platelet -     Comprehensive metabolic panel -     T4, free -     TSH  Chronic left-sided low back pain without sciatica Assessment & Plan: Serenitee is a 27 year old female, an established patient of this practice, here for a problem visit. Complains of chronic left-sided low back pain for almost 10 years. Appears to be very localized, musculoskeletal on exam and history and prior imaging.  Plan: Unfortunately she does not have medical insurance. -Reassured that this seems to be purely musculoskeletal. -Advised to try core strengthening exercises by looking up YouTube videos. -Stressed the importance of weight management. -Offered muscle relaxants but she declined. -To report back if any worse   Class 2 obesity due to excess calories without serious comorbidity with body mass index (BMI) of 37.0 to 37.9 in adult Assessment &  Plan: Class II obesity with BMI of 37.68. Weight management recommended. Advised to add strength training and add aerobic exercises if possible.  She states she is pretty physically active and does hiking etc. on a regular basis.  Orders: -     T4, free -     TSH     No orders of the defined types were placed in this encounter.   Orders Placed This Encounter  Procedures   CBC with Differential/Platelet   Comprehensive metabolic panel   T4, free   TSH     Follow-up: Return if symptoms worsen or fail to improve.  An After Visit Summary was printed and given to the patient.  Windell Moment, MD [facility] [phone]

## 2023-04-15 LAB — COMPREHENSIVE METABOLIC PANEL
ALT: 24 [IU]/L (ref 0–32)
AST: 22 [IU]/L (ref 0–40)
Albumin: 4.2 g/dL (ref 4.0–5.0)
Alkaline Phosphatase: 73 [IU]/L (ref 44–121)
BUN/Creatinine Ratio: 13 (ref 9–23)
BUN: 8 mg/dL (ref 6–20)
Bilirubin Total: 0.4 mg/dL (ref 0.0–1.2)
CO2: 20 mmol/L (ref 20–29)
Calcium: 9.3 mg/dL (ref 8.7–10.2)
Chloride: 105 mmol/L (ref 96–106)
Creatinine, Ser: 0.61 mg/dL (ref 0.57–1.00)
Globulin, Total: 2.8 g/dL (ref 1.5–4.5)
Glucose: 78 mg/dL (ref 70–99)
Potassium: 4.8 mmol/L (ref 3.5–5.2)
Sodium: 140 mmol/L (ref 134–144)
Total Protein: 7 g/dL (ref 6.0–8.5)
eGFR: 127 mL/min/{1.73_m2} (ref >59)

## 2023-04-15 LAB — TSH: TSH: 3.16 u[IU]/mL (ref 0.450–4.500)

## 2023-04-15 LAB — CBC WITH DIFFERENTIAL/PLATELET
Basophils Absolute: 0.1 10*3/uL (ref 0.0–0.2)
Basos: 1 %
EOS (ABSOLUTE): 0.1 10*3/uL (ref 0.0–0.4)
Eos: 1 %
Hematocrit: 41.6 % (ref 34.0–46.6)
Hemoglobin: 13.9 g/dL (ref 11.1–15.9)
Immature Grans (Abs): 0 10*3/uL (ref 0.0–0.1)
Immature Granulocytes: 0 %
Lymphocytes Absolute: 2.6 10*3/uL (ref 0.7–3.1)
Lymphs: 29 %
MCH: 31.5 pg (ref 26.6–33.0)
MCHC: 33.4 g/dL (ref 31.5–35.7)
MCV: 94 fL (ref 79–97)
Monocytes Absolute: 0.5 10*3/uL (ref 0.1–0.9)
Monocytes: 5 %
Neutrophils Absolute: 5.8 10*3/uL (ref 1.4–7.0)
Neutrophils: 64 %
Platelets: 334 10*3/uL (ref 150–450)
RBC: 4.41 x10E6/uL (ref 3.77–5.28)
RDW: 12.1 % (ref 11.7–15.4)
WBC: 9 10*3/uL (ref 3.4–10.8)

## 2023-04-15 LAB — T4, FREE: Free T4: 1.24 ng/dL (ref 0.82–1.77)
